# Patient Record
Sex: Female | Born: 1970 | Race: White | Hispanic: No | Smoking: Current every day smoker
Health system: Southern US, Community
[De-identification: ages and names within clinical notes are randomized; demographics above are authoritative.]

## PROBLEM LIST (undated history)

## (undated) DIAGNOSIS — E119 Type 2 diabetes mellitus without complications: Secondary | ICD-10-CM

## (undated) DIAGNOSIS — E039 Hypothyroidism, unspecified: Secondary | ICD-10-CM

## (undated) HISTORY — PX: TUBAL LIGATION: SHX77

## (undated) HISTORY — PX: LITHOTRIPSY: SUR834

---

## 2020-09-20 ENCOUNTER — Emergency Department (HOSPITAL_BASED_OUTPATIENT_CLINIC_OR_DEPARTMENT_OTHER)
Admission: EM | Admit: 2020-09-20 | Discharge: 2020-09-20 | Disposition: A | Discharge status: Home / Self Care | Payer: BC Managed Care – PPO | Attending: Emergency Medicine | Admitting: Emergency Medicine

## 2020-09-20 ENCOUNTER — Other Ambulatory Visit: Payer: Self-pay

## 2020-09-20 ENCOUNTER — Emergency Department (HOSPITAL_BASED_OUTPATIENT_CLINIC_OR_DEPARTMENT_OTHER): Payer: BC Managed Care – PPO

## 2020-09-20 ENCOUNTER — Encounter (HOSPITAL_BASED_OUTPATIENT_CLINIC_OR_DEPARTMENT_OTHER): Payer: Self-pay | Admitting: Emergency Medicine

## 2020-09-20 DIAGNOSIS — R42 Dizziness and giddiness: Secondary | ICD-10-CM | POA: Insufficient documentation

## 2020-09-20 DIAGNOSIS — F17200 Nicotine dependence, unspecified, uncomplicated: Secondary | ICD-10-CM | POA: Insufficient documentation

## 2020-09-20 DIAGNOSIS — E119 Type 2 diabetes mellitus without complications: Secondary | ICD-10-CM | POA: Insufficient documentation

## 2020-09-20 DIAGNOSIS — R079 Chest pain, unspecified: Secondary | ICD-10-CM

## 2020-09-20 DIAGNOSIS — R0602 Shortness of breath: Secondary | ICD-10-CM | POA: Diagnosis not present

## 2020-09-20 DIAGNOSIS — R0789 Other chest pain: Secondary | ICD-10-CM | POA: Insufficient documentation

## 2020-09-20 DIAGNOSIS — E039 Hypothyroidism, unspecified: Secondary | ICD-10-CM | POA: Diagnosis not present

## 2020-09-20 DIAGNOSIS — Z79899 Other long term (current) drug therapy: Secondary | ICD-10-CM | POA: Insufficient documentation

## 2020-09-20 HISTORY — DX: Type 2 diabetes mellitus without complications: E11.9

## 2020-09-20 HISTORY — DX: Hypothyroidism, unspecified: E03.9

## 2020-09-20 LAB — CBC WITH DIFFERENTIAL/PLATELET
Abs Immature Granulocytes: 0.01 10*3/uL (ref 0.00–0.07)
Basophils Absolute: 0.1 10*3/uL (ref 0.0–0.1)
Basophils Relative: 1 %
Eosinophils Absolute: 0.1 10*3/uL (ref 0.0–0.5)
Eosinophils Relative: 2 %
HCT: 41.5 % (ref 36.0–46.0)
Hemoglobin: 14.5 g/dL (ref 12.0–15.0)
Immature Granulocytes: 0 %
Lymphocytes Relative: 32 %
Lymphs Abs: 1.8 10*3/uL (ref 0.7–4.0)
MCH: 31.9 pg (ref 26.0–34.0)
MCHC: 34.9 g/dL (ref 30.0–36.0)
MCV: 91.4 fL (ref 80.0–100.0)
Monocytes Absolute: 0.5 10*3/uL (ref 0.1–1.0)
Monocytes Relative: 9 %
Neutro Abs: 3.3 10*3/uL (ref 1.7–7.7)
Neutrophils Relative %: 56 %
Platelets: 251 10*3/uL (ref 150–400)
RBC: 4.54 MIL/uL (ref 3.87–5.11)
RDW: 11.5 % (ref 11.5–15.5)
WBC: 5.7 10*3/uL (ref 4.0–10.5)
nRBC: 0 % (ref 0.0–0.2)

## 2020-09-20 LAB — COMPREHENSIVE METABOLIC PANEL
ALT: 30 U/L (ref 0–44)
AST: 26 U/L (ref 15–41)
Albumin: 4.3 g/dL (ref 3.5–5.0)
Alkaline Phosphatase: 45 U/L (ref 38–126)
Anion gap: 10 (ref 5–15)
BUN: 9 mg/dL (ref 6–20)
CO2: 24 mmol/L (ref 22–32)
Calcium: 9.1 mg/dL (ref 8.9–10.3)
Chloride: 104 mmol/L (ref 98–111)
Creatinine, Ser: 0.69 mg/dL (ref 0.44–1.00)
GFR, Estimated: >60 mL/min (ref >60)
Glucose, Bld: 127 mg/dL — ABNORMAL HIGH (ref 70–99)
Potassium: 3.7 mmol/L (ref 3.5–5.1)
Sodium: 138 mmol/L (ref 135–145)
Total Bilirubin: 1.8 mg/dL — ABNORMAL HIGH (ref 0.3–1.2)
Total Protein: 6.9 g/dL (ref 6.5–8.1)

## 2020-09-20 LAB — D-DIMER, QUANTITATIVE: D-Dimer, Quant: <0.27 ug/mL-FEU (ref 0.00–0.50)

## 2020-09-20 LAB — BRAIN NATRIURETIC PEPTIDE: B Natriuretic Peptide: 17.8 pg/mL (ref 0.0–100.0)

## 2020-09-20 LAB — TROPONIN I (HIGH SENSITIVITY): Troponin I (High Sensitivity): 2 ng/L (ref <18)

## 2020-09-20 MED ORDER — IOHEXOL 350 MG/ML SOLN
100.0000 mL | Freq: Once | INTRAVENOUS | Status: AC | PRN
  Administered 2020-09-20: 100 mL via INTRAVENOUS

## 2020-09-20 MED ORDER — SODIUM CHLORIDE 0.9 % IV BOLUS
1000.0000 mL | Freq: Once | INTRAVENOUS | Status: AC
  Administered 2020-09-20: 1000 mL via INTRAVENOUS

## 2020-09-20 NOTE — ED Notes (Signed)
Patient transported to CT 

## 2020-09-20 NOTE — ED Provider Notes (Signed)
[facility] EMERGENCY DEPARTMENT Provider Note   CSN: [id number] Arrival date & time: 09/20/20  3664     History Chief Complaint  Patient presents with  . Chest Pain    Maria Roman is a 49 y.o. female.  Patient is a 49 year old female with history of diabetes, hypothyroidism. She presents today with complaints of chest discomfort. Patient reports experiencing this for the past several weeks. She describes a constant ache to the left side of her chest that radiates into her arm and neck. The pain is always there, but does wax and wane in severity. She feels short of breath and has difficulty walking short distances without becoming dyspneic. She denies any fevers or chills. She denies any productive cough. She denies any swelling in her legs. She has no prior cardiac history, but does describe mitral valve issues in her family that has caused at least 1 family member to develop congestive heart failure. She has been seen twice at [facility], but no cause has been found.  The history is provided by the patient.  Chest Pain Pain location:  L chest Pain quality: aching   Pain radiates to:  Neck and L arm Pain severity:  Moderate Onset quality:  Sudden Timing:  Constant Progression:  Worsening Chronicity:  New Relieved by:  Nothing Worsened by:  Exertion and deep breathing Ineffective treatments:  None tried Associated symptoms: dizziness        Past Medical History:  Diagnosis Date  . Diabetes mellitus without complication (HCC)   . Hypothyroidism     There are no problems to display for this patient.   Past Surgical History:  Procedure Laterality Date  . LITHOTRIPSY    . TUBAL LIGATION       OB History   No obstetric history on file.     No family history on file.  Social History   Tobacco Use  . Smoking status: Current Every Day Smoker  . Smokeless tobacco: Never Used  Substance Use Topics  . Alcohol use: Yes  . Drug use: Not  Currently    Home Medications Prior to Admission medications   Medication Sig Start Date End Date Taking? Authorizing Provider  levothyroxine (SYNTHROID) 137 MCG tablet Take by mouth. 09/06/20  Yes [provider]  baclofen (LIORESAL) 10 MG tablet Take 10 mg by mouth 3 (three) times daily. 09/06/20   [provider]  OZEMPIC, 1 MG/DOSE, 4 MG/3ML SOPN  08/08/20   [provider]  rosuvastatin (CRESTOR) 10 MG tablet Take by mouth.    [provider]    Allergies    Bupropion, Codeine, and Pravastatin  Review of Systems   Review of Systems  Cardiovascular: Positive for chest pain.  Neurological: Positive for dizziness.  All other systems reviewed and are negative.   Physical Exam Updated Vital Signs BP (!) 131/99 (BP Location: Right Arm)   Pulse 88   Temp 98.2 F (36.8 C) (Oral)   Resp 18   Ht 5\' 8"  (1.727 m)   Wt 79.4 kg   LMP 09/13/2020   SpO2 100%   BMI 26.61 kg/m   Physical Exam Vitals and nursing note reviewed.  Constitutional:      General: She is not in acute distress.    Appearance: She is well-developed. She is not diaphoretic.  HENT:     Head: Normocephalic and atraumatic.  Cardiovascular:     Rate and Rhythm: Normal rate and regular rhythm.     Heart  sounds: No murmur heard.  No friction rub. No gallop.   Pulmonary:     Effort: Pulmonary effort is normal. No respiratory distress.     Breath sounds: Normal breath sounds. No wheezing.  Abdominal:     General: Bowel sounds are normal. There is no distension.     Palpations: Abdomen is soft.     Tenderness: There is no abdominal tenderness.  Musculoskeletal:        General: Normal range of motion.     Cervical back: Normal range of motion and neck supple.     Right lower leg: No tenderness. No edema.     Left lower leg: No tenderness. No edema.  Skin:    General: Skin is warm and dry.  Neurological:     Mental Status: She is alert and oriented to person, place, and  time.     ED Results / Procedures / Treatments   Labs (all labs ordered are listed, but only abnormal results are displayed) Labs Reviewed - No data to display  EKG EKG Interpretation  Date/Time:  Tuesday September 20 2020 06:53:45 EST Ventricular Rate:  86 PR Interval:    QRS Duration: 93 QT Interval:  373 QTC Calculation: 447 R Axis:   -33 Text Interpretation: Sinus rhythm Left axis deviation Abnormal R-wave progression, early transition No previous ECGs available Confirmed by Paula Libra ([id number]) on 09/20/2020 6:55:46 AM   Radiology No results found.  Procedures Procedures (including critical care time)  Medications Ordered in ED Medications - No data to display  ED Course  I have reviewed the triage vital signs and the nursing notes.  Pertinent labs & imaging results that were available during my care of the patient were reviewed by me and considered in my medical decision making (see chart for details).    MDM Rules/Calculators/A&P  Patient is a 49 year old female presenting with complaints of chest discomfort, the specifics of which are described in the HPI.  She arrives here with stable vital signs and no hypoxia or fever.  Initial EKG shows a normal sinus rhythm with no acute changes.  Work-up initiated including CBC, metabolic panel, troponin, and D-dimer.  All of these are unremarkable.  CTA of the chest was obtained as the patient has expressed frustration with not having an explanation for her discomfort from 2 prior ER visits at [facility].  This study showed no evidence for PE or other acute pathology.  At this point, it is unclear to me as to what the cause of her symptoms is, but nothing today appears emergent.  There is no evidence for dissection, acute coronary syndrome, pulmonary embolism, or other cause.  She seems appropriate for discharge with outpatient cardiology follow-up.  A referral to the [facility] [place] [facility] cardiology clinic will be made.  Patient to  follow-up with them and return in the meantime if she worsens.  Final Clinical Impression(s) / ED Diagnoses Final diagnoses:  None    Rx / DC Orders ED Discharge Orders    None       Geoffery Lyons, MD 09/20/20 1000

## 2020-09-20 NOTE — ED Triage Notes (Signed)
Patient tearful during triage  

## 2020-09-20 NOTE — ED Triage Notes (Signed)
Patient presents with complaints of left chest pain onset 1-2 months ago; states shortness of breath, fatigue, and dizziness. States seen for same recently.

## 2020-09-20 NOTE — Discharge Instructions (Addendum)
Take ibuprofen 600 mg every 6 hours as needed for pain.  Follow-up with cardiology in the next few days.  The contact information for the [facility] cardiology clinic has been provided in this discharge summary for you to call and make these arrangements.  Return to the ER in the meantime if you develop worsening pain, high fever, difficulty breathing, or other new and concerning symptoms.

## 2021-11-12 IMAGING — CT CT ANGIO CHEST
3 of 9 series · 18 of 36 positions shown · IV contrast (Omnipaque)
Comparison: Prior chest x-ray 09/01/2020

CLINICAL DATA: High probability suspicion for pulmonary embolus.
Left chest pain, shortness of breath

EXAM:
CT ANGIOGRAPHY CHEST WITH CONTRAST
TECHNIQUE: Multidetector CT imaging of the chest was performed using the
standard protocol during bolus administration of intravenous
contrast. Multiplanar CT image reconstructions and MIPs were
obtained to evaluate the vascular anatomy.
CONTRAST:  100mL OMNIPAQUE IOHEXOL 350 MG/ML SOLN

[Series 5: pe lung · axial · 0.75mm/px · z∈[-78,-9]mm · 2 of 69 slices shown]
[im 23/69  mediastinal]
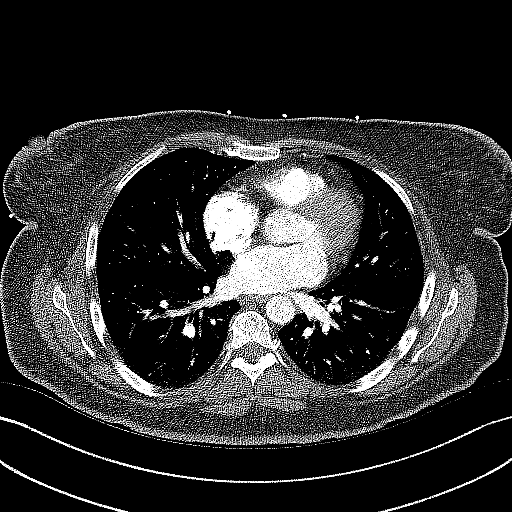
[im 46/69  mediastinal]
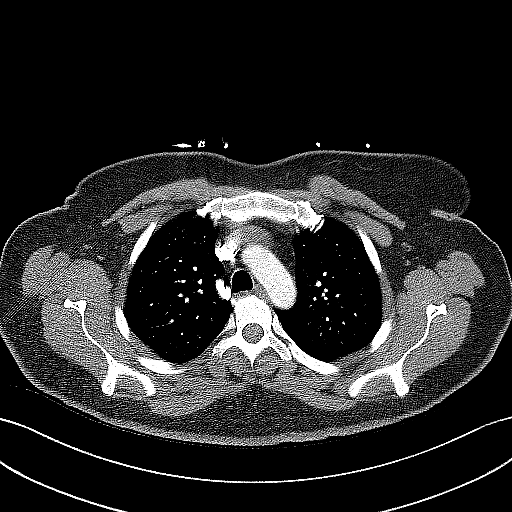

[Series 6: pe thins · axial · 0.74mm/px · z∈[-184,+43]mm · 15 of 261 slices shown]
[im 17/261  lung]
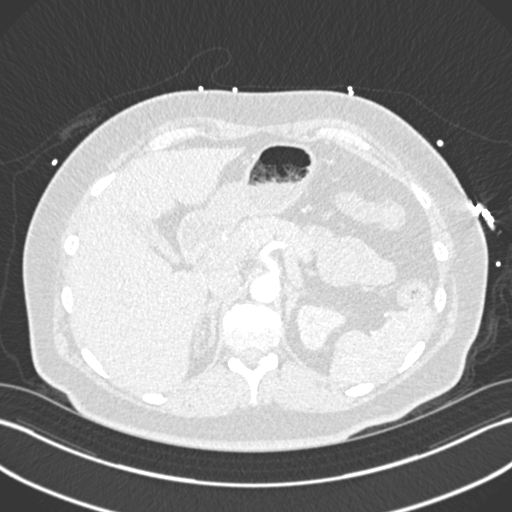
[im 33/261  mediastinal]
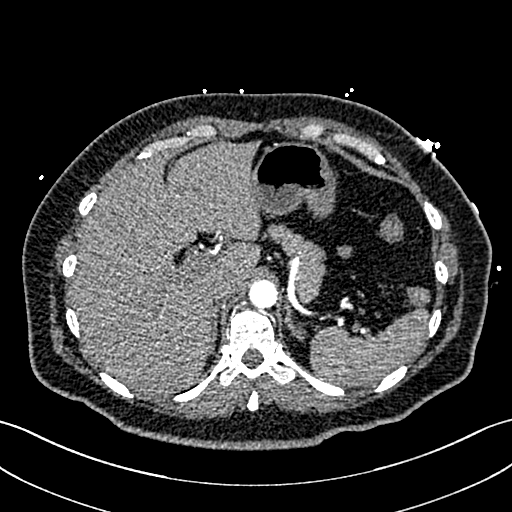
[im 49/261  lung]
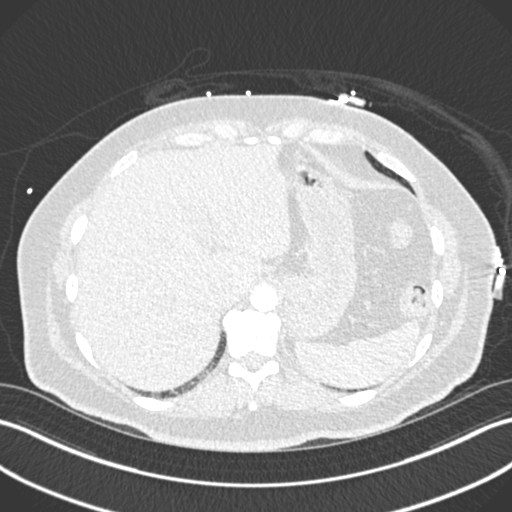
[im 66/261  mediastinal]
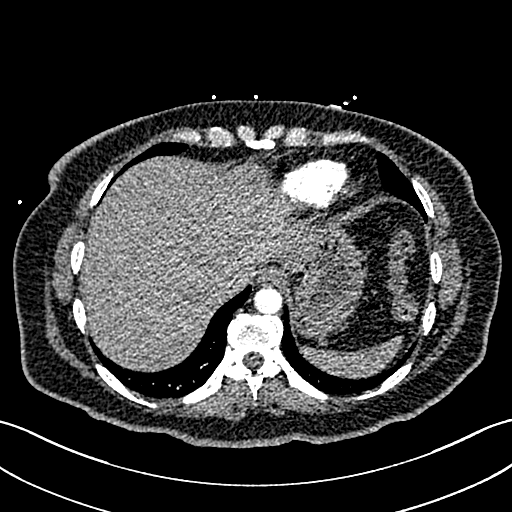
[im 82/261  lung]
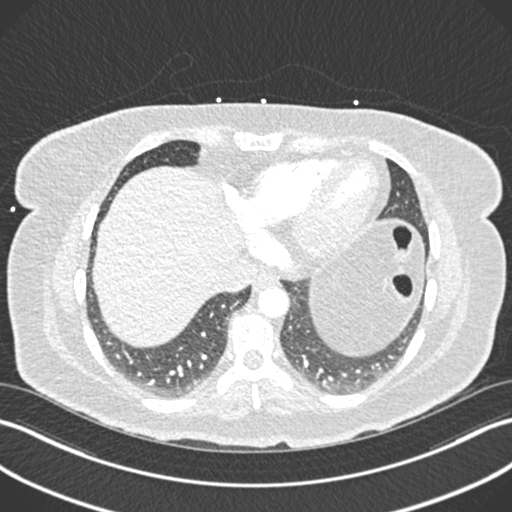
[im 98/261  mediastinal]
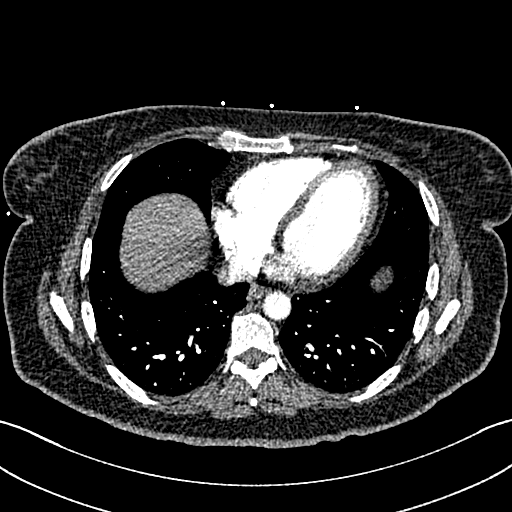
[im 114/261  lung]
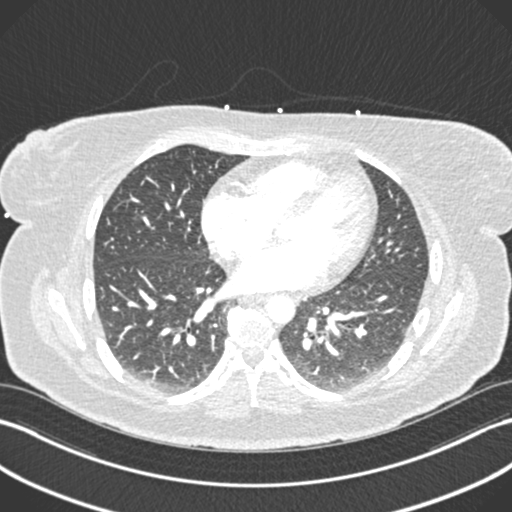
[im 131/261  mediastinal]
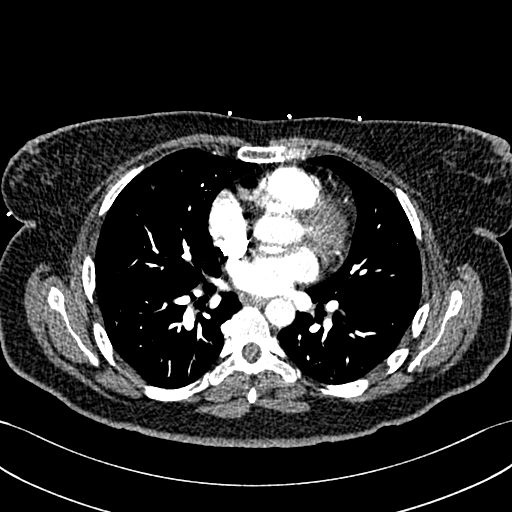
[im 147/261  lung]
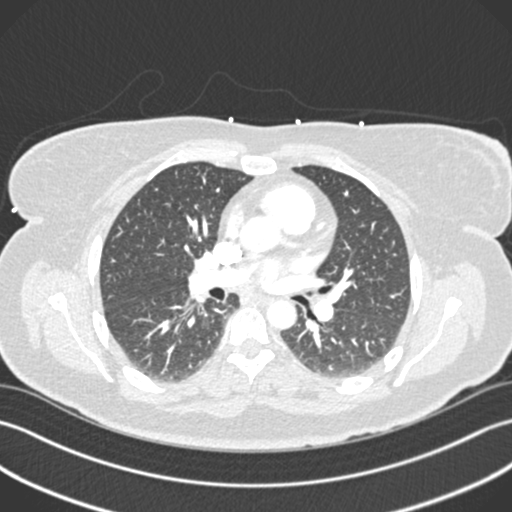
[im 163/261  mediastinal]
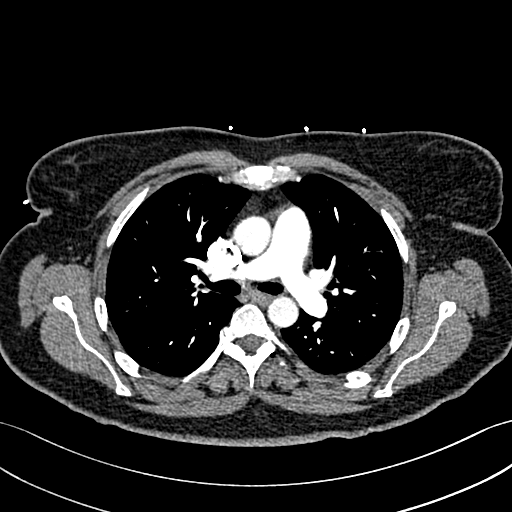
[im 179/261  lung]
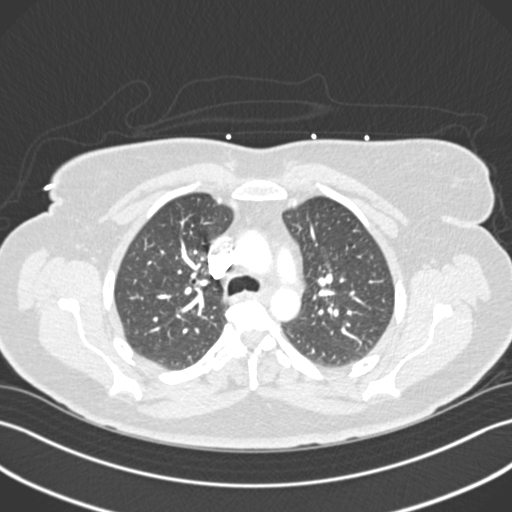
[im 196/261  mediastinal]
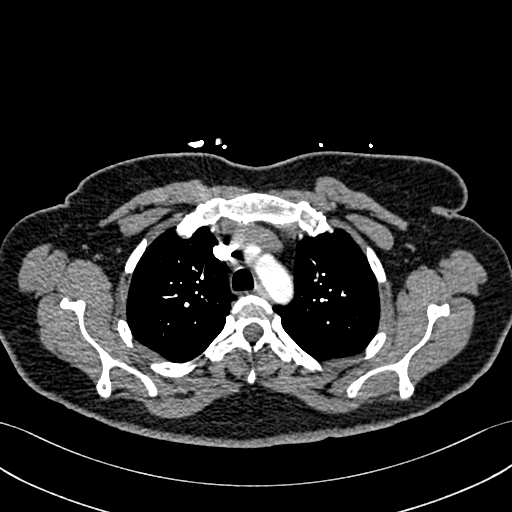
[im 212/261  lung]
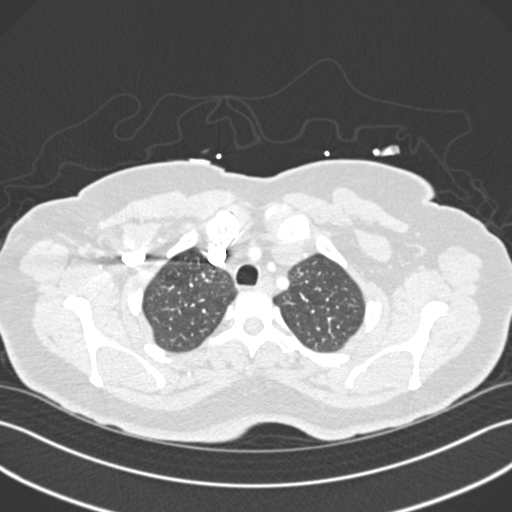
[im 228/261  mediastinal]
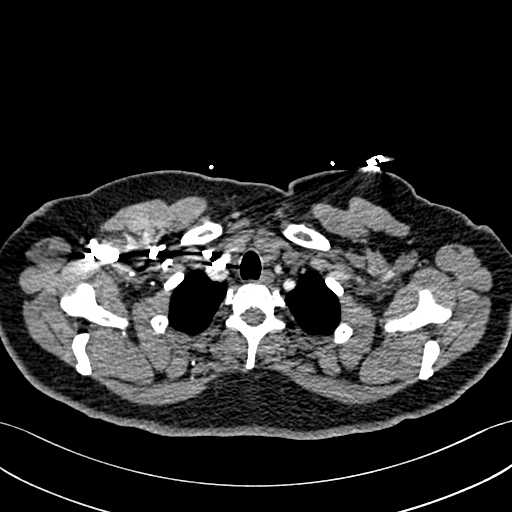
[im 244/261  lung]
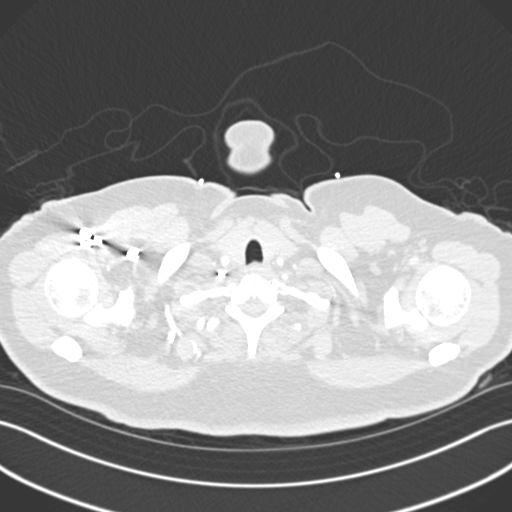

[Series 7: pe coronal mpr · coronal · 0.53mm/px · 1 of 133 slices shown]
[im 67/133  mediastinal]
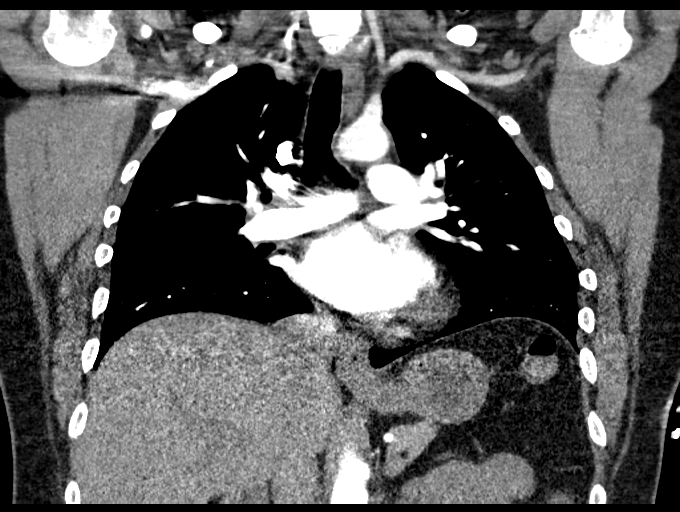

[18 of 36 positions shown; findings below may reference images not displayed]

FINDINGS: Cardiovascular: Satisfactory opacification of the pulmonary arteries
to the segmental level. No evidence of pulmonary embolism. Normal
heart size. No pericardial effusion.

Mediastinum/Nodes: No enlarged mediastinal, hilar, or axillary lymph
nodes. Thyroid gland, trachea, and esophagus demonstrate no
significant findings.

Lungs/Pleura: 0.6 cm subpleural nodule along the inferior aspect of
the minor fissure (image 44 series 5). The lungs are otherwise
clear.

Upper Abdomen: No acute abnormality.

Musculoskeletal: No chest wall abnormality. No acute or significant
osseous findings.

Review of the MIP images confirms the above findings.
IMPRESSION: 1. Negative for acute pulmonary embolus, pneumonia or other acute
cardiopulmonary process.
2. Small 6 mm subpleural pulmonary nodule along the inferior aspect
of the minor fissure is almost certainly benign.
